# Patient Record
Sex: Male | Born: 1975 | Hispanic: No | Marital: Single | Smoking: Never smoker
Health system: Southern US, Community
[De-identification: ages and names within clinical notes are randomized; demographics above are authoritative.]

---

## 2018-03-26 ENCOUNTER — Encounter (HOSPITAL_COMMUNITY): Payer: Self-pay

## 2018-03-26 ENCOUNTER — Other Ambulatory Visit: Payer: Self-pay

## 2018-03-26 ENCOUNTER — Emergency Department (HOSPITAL_COMMUNITY)
Admission: EM | Admit: 2018-03-26 | Discharge: 2018-03-26 | Disposition: A | Discharge status: Home / Self Care | Payer: Worker's Compensation | Attending: Emergency Medicine | Admitting: Emergency Medicine

## 2018-03-26 ENCOUNTER — Emergency Department (HOSPITAL_COMMUNITY): Payer: Worker's Compensation

## 2018-03-26 DIAGNOSIS — R55 Syncope and collapse: Secondary | ICD-10-CM | POA: Diagnosis present

## 2018-03-26 DIAGNOSIS — T671XXA Heat syncope, initial encounter: Secondary | ICD-10-CM | POA: Diagnosis not present

## 2018-03-26 DIAGNOSIS — R079 Chest pain, unspecified: Secondary | ICD-10-CM | POA: Insufficient documentation

## 2018-03-26 DIAGNOSIS — E860 Dehydration: Secondary | ICD-10-CM

## 2018-03-26 LAB — TROPONIN I: Troponin I: <0.03 ng/mL (ref <0.03)

## 2018-03-26 LAB — COMPREHENSIVE METABOLIC PANEL
ALBUMIN: 4.2 g/dL (ref 3.5–5.0)
ALK PHOS: 83 U/L (ref 38–126)
ALT: 37 U/L (ref 0–44)
AST: 40 U/L (ref 15–41)
Anion gap: 8 (ref 5–15)
BUN: 22 mg/dL — ABNORMAL HIGH (ref 6–20)
CALCIUM: 8.8 mg/dL — AB (ref 8.9–10.3)
CO2: 23 mmol/L (ref 22–32)
CREATININE: 0.75 mg/dL (ref 0.61–1.24)
Chloride: 110 mmol/L (ref 98–111)
GFR calc Af Amer: >60 mL/min (ref >60)
GFR calc non Af Amer: >60 mL/min (ref >60)
GLUCOSE: 86 mg/dL (ref 70–99)
Potassium: 3.5 mmol/L (ref 3.5–5.1)
SODIUM: 141 mmol/L (ref 135–145)
Total Bilirubin: 0.8 mg/dL (ref 0.3–1.2)
Total Protein: 7.7 g/dL (ref 6.5–8.1)

## 2018-03-26 LAB — CBC WITH DIFFERENTIAL/PLATELET
BASOS ABS: 0 10*3/uL (ref 0.0–0.1)
BASOS PCT: 0 %
EOS ABS: 0 10*3/uL (ref 0.0–0.7)
Eosinophils Relative: 0 %
HCT: 45.2 % (ref 39.0–52.0)
HEMOGLOBIN: 15.7 g/dL (ref 13.0–17.0)
Lymphocytes Relative: 17 %
Lymphs Abs: 1.7 10*3/uL (ref 0.7–4.0)
MCH: 31.9 pg (ref 26.0–34.0)
MCHC: 34.7 g/dL (ref 30.0–36.0)
MCV: 91.9 fL (ref 78.0–100.0)
Monocytes Absolute: 0.8 10*3/uL (ref 0.1–1.0)
Monocytes Relative: 8 %
Neutro Abs: 7.7 10*3/uL (ref 1.7–7.7)
Neutrophils Relative %: 75 %
Platelets: 282 10*3/uL (ref 150–400)
RBC: 4.92 MIL/uL (ref 4.22–5.81)
RDW: 13.1 % (ref 11.5–15.5)
WBC: 10.3 10*3/uL (ref 4.0–10.5)

## 2018-03-26 LAB — URINALYSIS, ROUTINE W REFLEX MICROSCOPIC
Bilirubin Urine: NEGATIVE
GLUCOSE, UA: NEGATIVE mg/dL
Hgb urine dipstick: NEGATIVE
Ketones, ur: 20 mg/dL — AB
LEUKOCYTES UA: NEGATIVE
NITRITE: NEGATIVE
PH: 7 (ref 5.0–8.0)
PROTEIN: NEGATIVE mg/dL
Specific Gravity, Urine: 1.024 (ref 1.005–1.030)

## 2018-03-26 LAB — CK
Total CK: 751 U/L — ABNORMAL HIGH (ref 49–397)
Total CK: 607 U/L — ABNORMAL HIGH (ref 49–397)

## 2018-03-26 LAB — D-DIMER, QUANTITATIVE (NOT AT ARMC)

## 2018-03-26 MED ORDER — SODIUM CHLORIDE 0.9 % IV BOLUS
1000.0000 mL | Freq: Once | INTRAVENOUS | Status: AC
  Administered 2018-03-26: 1000 mL via INTRAVENOUS

## 2018-03-26 NOTE — Discharge Instructions (Signed)
Rest and make sure you are drinking plenty of fluids. Protect yourself from the heat as much as possible for the next several days as you recover from this heat exhaustion.  I recommend a day of rest before returning to work.  It is helpful to make sure you eat a salty snack with your breaks to make sure you are replacing the salt you lose from sweating while you work.

## 2018-03-26 NOTE — ED Notes (Signed)
Pt states he does not need to urinate at this time, aware of DO  

## 2018-03-26 NOTE — ED Provider Notes (Signed)
[facility]GENCY DEPARTMENT Provider Note   CSN: [id number] Arrival date & time: 03/26/18  1256     History   Chief Complaint Chief Complaint  Patient presents with  . 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  He states he has been doing similar work all summer without issues.  He has drank about 4 cups of water during his work and also had a break at 9:30 when he drank juice and a soda along with eating a sandwich.  He currently feels very weak and reports having chest pain described as aching when he takes a breath.  He denies shortness of breath.  He reports he was actively perspiring prior to this event.  He denies headache, n/v/ palpitations, no abdominal pain.  He has been given ice packs and he states someone wet his clothing with water while in the field.  The episode was witnessed, he was not out for an extended time.  He cannot pinpoint the time this occurred.  The history is provided by the patient.    History reviewed. No pertinent past medical history.  There are no active problems to display for this patient.   History reviewed. No pertinent surgical history.      Home Medications    Prior to Admission medications   Not on File    Family History No family history on file.  Social History Social History   Tobacco Use  . Smoking status: Never Smoker  . Smokeless tobacco: Never Used  Substance Use Topics  . Alcohol use: Never    Frequency: Never  . Drug use: Never     Allergies   Patient has no known allergies.   Review of Systems Review of Systems  Constitutional: Positive for fatigue. Negative for fever.  HENT: Negative for congestion.   Eyes: Negative.   Respiratory: Negative for chest tightness and shortness of breath.   Cardiovascular: Positive for chest pain.  Gastrointestinal: Negative for  abdominal pain, nausea and vomiting.  Genitourinary: Negative.   Musculoskeletal: Negative for arthralgias, joint swelling and neck pain.  Skin: Negative.  Negative for rash and wound.  Neurological: Positive for syncope and weakness. Negative for dizziness, seizures, light-headedness, numbness and headaches.  Psychiatric/Behavioral: Negative.      Physical Exam Updated Vital Signs BP 124/84   Pulse 60   Temp (!) 97.5 F (36.4 C) (Temporal)   Resp 20   Ht 5\' 6"  (1.676 m)   Wt 77.1 kg   SpO2 97%   BMI 27.44 kg/m   Physical Exam  Constitutional: He appears well-developed and well-nourished.  HENT:  Head: Normocephalic and atraumatic.  Eyes: Conjunctivae are normal.  Neck: Normal range of motion.  Cardiovascular: Normal rate, regular rhythm, normal heart sounds and intact distal pulses. Exam reveals no gallop and no friction rub.  No murmur heard. Pulses:      Radial pulses are 2+ on the right side, and 2+ on the left side.       Dorsalis pedis pulses are 2+ on the right side, and 2+ on the left side.  Less than 2 sec cap refill in fingertips  Pulmonary/Chest: Effort normal and breath sounds normal. He has no wheezes.  Abdominal: Soft. Bowel sounds are normal. There is no tenderness.  Musculoskeletal: Normal range of motion.  Neurological: He is alert.  Skin:  Skin is warm and dry.  Psychiatric: He has a normal mood and affect.  Nursing note and vitals reviewed.    ED Treatments / Results  Labs (all labs ordered are listed, but only abnormal results are displayed) Labs Reviewed  COMPREHENSIVE METABOLIC PANEL - Abnormal; Notable for the following components:      Result Value   BUN 22 (*)    Calcium 8.8 (*)    All other components within normal limits  CK - Abnormal; Notable for the following components:   Total CK 751 (*)    All other components within normal limits  URINALYSIS, ROUTINE W REFLEX MICROSCOPIC - Abnormal; Notable for the following components:    Ketones, ur 20 (*)    All other components within normal limits  CK - Abnormal; Notable for the following components:   Total CK 607 (*)    All other components within normal limits  CBC WITH DIFFERENTIAL/PLATELET  TROPONIN I  D-DIMER, QUANTITATIVE (NOT AT [facility])  TROPONIN I    EKG EKG Interpretation  Date/Time:  Tuesday March 26 2018 13:02:57 EDT Ventricular Rate:  66 PR Interval:    QRS Duration: 83 QT Interval:  398 QTC Calculation: 417 R Axis:   90 Text Interpretation:  Sinus rhythm Consider left atrial enlargement Borderline right axis deviation ST elev, probable normal early repol pattern No previous ECGs available Confirmed by Vanetta MuldersZackowski, Scott [phone]([id number]) on 03/26/2018 1:24:54 PM   Radiology Dg Chest Portable 1 View  Result Date: 03/26/2018 CLINICAL DATA:  42 year old who passed out while working outside in a field earlier today. EXAM: PORTABLE CHEST 1 VIEW COMPARISON:  None. FINDINGS: Cardiac silhouette and mediastinal contours normal in appearance for the AP portable technique. Pulmonary parenchyma clear. Bronchovascular markings normal. Pulmonary vascularity normal. No pneumothorax. No visible pleural effusions. IMPRESSION: No acute cardiopulmonary disease. Electronically Signed   By: Hulan Saashomas  Lawrence M.D.   On: 03/26/2018 13:42    Procedures Procedures (including critical care time)  Medications Ordered in ED Medications  sodium chloride 0.9 % bolus 1,000 mL (0 mLs Intravenous Stopped 03/26/18 1450)  sodium chloride 0.9 % bolus 1,000 mL (0 mLs Intravenous Stopped 03/26/18 1609)     Initial Impression / Assessment and Plan / ED Course  I have reviewed the triage vital signs and the nursing notes.  Pertinent labs & imaging results that were available during my care of the patient were reviewed by me and considered in my medical decision making (see chart for details).     Labs reviewed including delta troponins which remain negative, ck which has trended down with IV   and po fluid intake here, other labs, cxr, ekg negative.  Small ketones in urine.  Discussed home tx for avoiding dehydration, more frequent breaks, increased fluids, salty foods with break. Prn f/u anticipated.  Final Clinical Impressions(s) / ED Diagnoses   Final diagnoses:  Heat syncope, initial encounter  Dehydration after exertion    ED Discharge Orders    None       Victoriano Laindol, Boy Delamater, PA-C 03/26/18 1740    Vanetta MuldersZackowski, Scott, MD 03/28/18 [phone]

## 2018-03-26 NOTE — ED Notes (Signed)
Pt given 2 cups of water 

## 2018-03-26 NOTE — ED Triage Notes (Signed)
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  Co-workers removed pt's clothes and poured cold water on him.  Pt alert and oriented.  CBG 146.  Pt denies complaints but says feels hot.

## 2019-03-09 IMAGING — CR DG CHEST 1V PORT
1 series · 1 of 1 positions shown · non-contrast
Comparison: None.

CLINICAL DATA: 41-year-old who passed out while working outside in
a field earlier today.

EXAM:
PORTABLE CHEST 1 VIEW

[portable]
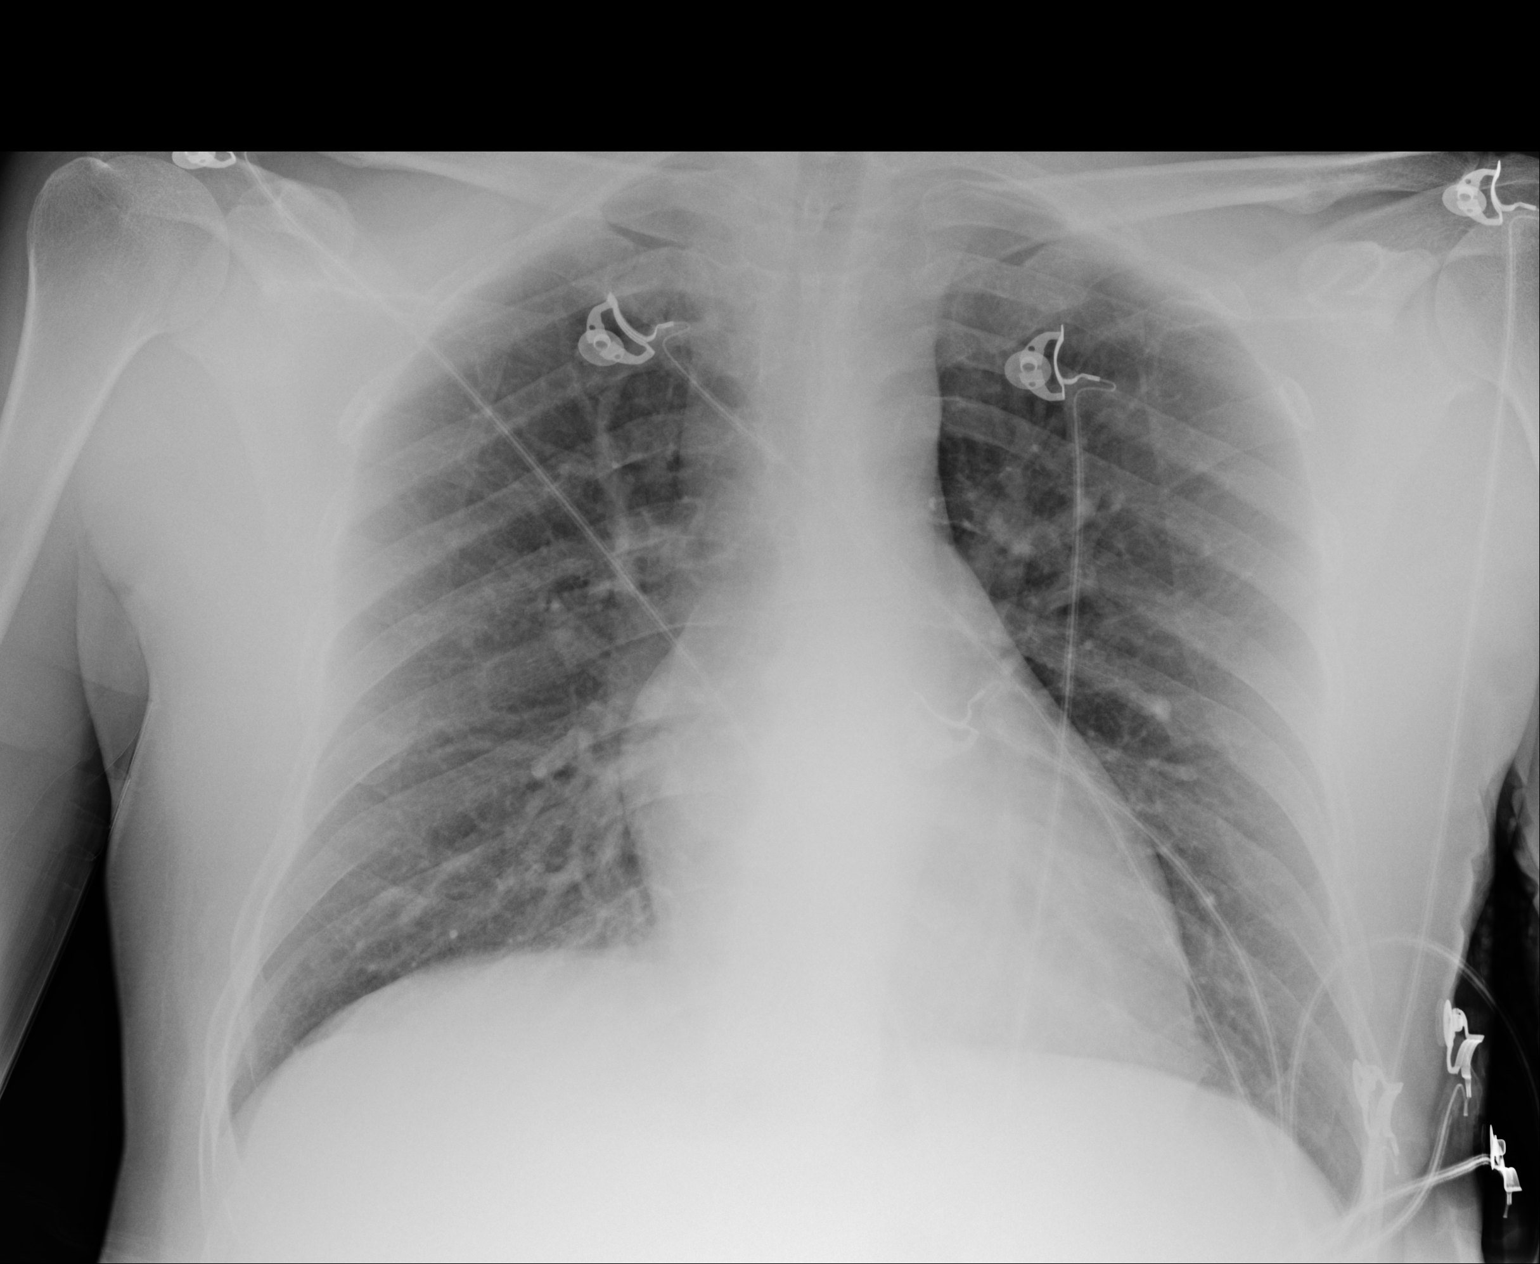

[1 of 1 positions shown; findings below may reference images not displayed]

FINDINGS: Cardiac silhouette and mediastinal contours normal in appearance for
the AP portable technique. Pulmonary parenchyma clear.
Bronchovascular markings normal. Pulmonary vascularity normal. No
pneumothorax. No visible pleural effusions.
IMPRESSION: No acute cardiopulmonary disease.
